# Patient Record
Sex: Female | Born: 1935 | Race: White | Hispanic: No | Smoking: Former smoker
Health system: Southern US, Community
[De-identification: ages and names within clinical notes are randomized; demographics above are authoritative.]

## PROBLEM LIST (undated history)

## (undated) DIAGNOSIS — M810 Age-related osteoporosis without current pathological fracture: Secondary | ICD-10-CM

## (undated) DIAGNOSIS — J329 Chronic sinusitis, unspecified: Secondary | ICD-10-CM

## (undated) DIAGNOSIS — K219 Gastro-esophageal reflux disease without esophagitis: Secondary | ICD-10-CM

## (undated) DIAGNOSIS — E119 Type 2 diabetes mellitus without complications: Secondary | ICD-10-CM

## (undated) DIAGNOSIS — N183 Chronic kidney disease, stage 3 unspecified: Secondary | ICD-10-CM

## (undated) DIAGNOSIS — K469 Unspecified abdominal hernia without obstruction or gangrene: Secondary | ICD-10-CM

## (undated) DIAGNOSIS — J449 Chronic obstructive pulmonary disease, unspecified: Secondary | ICD-10-CM

## (undated) DIAGNOSIS — M5430 Sciatica, unspecified side: Secondary | ICD-10-CM

## (undated) DIAGNOSIS — M549 Dorsalgia, unspecified: Secondary | ICD-10-CM

## (undated) HISTORY — PX: HERNIA REPAIR: SHX51

## (undated) HISTORY — PX: OTHER SURGICAL HISTORY: SHX169

## (undated) HISTORY — DX: Chronic kidney disease, stage 3 unspecified: N18.30

## (undated) HISTORY — PX: CHOLECYSTECTOMY: SHX55

## (undated) HISTORY — DX: Chronic obstructive pulmonary disease, unspecified: J44.9

## (undated) HISTORY — DX: Age-related osteoporosis without current pathological fracture: M81.0

## (undated) HISTORY — DX: Chronic sinusitis, unspecified: J32.9

## (undated) HISTORY — DX: Sciatica, unspecified side: M54.30

## (undated) HISTORY — DX: Gastro-esophageal reflux disease without esophagitis: K21.9

## (undated) HISTORY — DX: Unspecified abdominal hernia without obstruction or gangrene: K46.9

## (undated) HISTORY — PX: BLADDER SUSPENSION: SHX72

## (undated) HISTORY — PX: DILATION AND CURETTAGE OF UTERUS: SHX78

## (undated) HISTORY — DX: Type 2 diabetes mellitus without complications: E11.9

## (undated) HISTORY — DX: Chronic kidney disease, stage 3 (moderate): N18.3

## (undated) HISTORY — DX: Dorsalgia, unspecified: M54.9

---

## 2018-10-12 ENCOUNTER — Encounter (INDEPENDENT_AMBULATORY_CARE_PROVIDER_SITE_OTHER): Payer: Self-pay | Admitting: *Deleted

## 2018-10-12 ENCOUNTER — Encounter (INDEPENDENT_AMBULATORY_CARE_PROVIDER_SITE_OTHER): Payer: Self-pay | Admitting: Internal Medicine

## 2018-10-12 ENCOUNTER — Ambulatory Visit (INDEPENDENT_AMBULATORY_CARE_PROVIDER_SITE_OTHER): Payer: Medicare Other | Admitting: Internal Medicine

## 2018-10-12 VITALS — BP 118/72 | HR 99 | Temp 97.7°F | Ht 67.0 in | Wt 151.3 lb

## 2018-10-12 DIAGNOSIS — R197 Diarrhea, unspecified: Secondary | ICD-10-CM | POA: Insufficient documentation

## 2018-10-12 DIAGNOSIS — K5900 Constipation, unspecified: Secondary | ICD-10-CM | POA: Insufficient documentation

## 2018-10-12 DIAGNOSIS — R110 Nausea: Secondary | ICD-10-CM

## 2018-10-12 DIAGNOSIS — R634 Abnormal weight loss: Secondary | ICD-10-CM | POA: Insufficient documentation

## 2018-10-12 LAB — CBC WITH DIFFERENTIAL/PLATELET
Absolute Monocytes: 608 cells/uL (ref 200–950)
BASOS PCT: 0.8 %
Basophils Absolute: 64 cells/uL (ref 0–200)
Eosinophils Absolute: 288 cells/uL (ref 15–500)
Eosinophils Relative: 3.6 %
HCT: 36.5 % (ref 35.0–45.0)
Hemoglobin: 12 g/dL (ref 11.7–15.5)
Lymphs Abs: 3240 cells/uL (ref 850–3900)
MCH: 28.6 pg (ref 27.0–33.0)
MCHC: 32.9 g/dL (ref 32.0–36.0)
MCV: 86.9 fL (ref 80.0–100.0)
MPV: 10.6 fL (ref 7.5–12.5)
Monocytes Relative: 7.6 %
Neutro Abs: 3800 cells/uL (ref 1500–7800)
Neutrophils Relative %: 47.5 %
Platelets: 248 10*3/uL (ref 140–400)
RBC: 4.2 10*6/uL (ref 3.80–5.10)
RDW: 12.1 % (ref 11.0–15.0)
TOTAL LYMPHOCYTE: 40.5 %
WBC: 8 10*3/uL (ref 3.8–10.8)

## 2018-10-12 LAB — COMPREHENSIVE METABOLIC PANEL
AG Ratio: 1.6 (calc) (ref 1.0–2.5)
ALT: 18 U/L (ref 6–29)
AST: 23 U/L (ref 10–35)
Albumin: 4 g/dL (ref 3.6–5.1)
Alkaline phosphatase (APISO): 52 U/L (ref 33–130)
BUN: 17 mg/dL (ref 7–25)
CO2: 29 mmol/L (ref 20–32)
Calcium: 9.4 mg/dL (ref 8.6–10.4)
Chloride: 105 mmol/L (ref 98–110)
Creat: 0.69 mg/dL (ref 0.60–0.88)
Globulin: 2.5 g/dL (calc) (ref 1.9–3.7)
Glucose, Bld: 104 mg/dL (ref 65–139)
Potassium: 4.2 mmol/L (ref 3.5–5.3)
SODIUM: 144 mmol/L (ref 135–146)
TOTAL PROTEIN: 6.5 g/dL (ref 6.1–8.1)
Total Bilirubin: 0.3 mg/dL (ref 0.2–1.2)

## 2018-10-12 NOTE — Patient Instructions (Signed)
Labs and US abdomen.  

## 2018-10-12 NOTE — Progress Notes (Signed)
   Subjective:    Patient ID: Taylor Sellers, female    DOB: 09-24-1932, 85 y.o.   MRN: [id number] Oxygen dependent. HPI Referred by Gaspar Skeeters MD  for abdominal pain. She has nausea, constipation, diarrhea. Symptoms for years. She alternates between constipation and diarrhea. She takes Gummie fiber for her diarrhea which has helped. She has a BM day which she describes as small and soft. She is incontinent of urine. She says sometimes she has nausea which comes on suddenly which occurs several times a week which she says is related to eating.  She tells me she has lost from 200 to 151 over a 3 yrs period since her husband died.  She says her appetite is not good since her husband died.  Last colonoscopy was normal in [place] 02/1998.     Review of Systems Past Medical History:  Diagnosis Date  . Abdominal hernia   . Back pain   . CKD (chronic kidney disease), stage III (HCC)   . COPD (chronic obstructive pulmonary disease) (HCC)   . Diabetes (HCC)   . GERD (gastroesophageal reflux disease)   . Osteoporosis   . Sciatica   . Sinusitis       Allergies  Allergen Reactions  . Augmentin [Amoxicillin-Pot Clavulanate]   . Bactrim [Sulfamethoxazole-Trimethoprim]   . Biaxin [Clarithromycin]   . Ceclor [Cefaclor]   . Codeine   . Flagyl [Metronidazole]   . Ultracet [Tramadol-Acetaminophen]     Current Outpatient Medications on File Prior to Visit  Medication Sig Dispense Refill  . albuterol (PROVENTIL HFA;VENTOLIN HFA) 108 (90 Base) MCG/ACT inhaler Inhale into the lungs every 6 (six) hours as needed for wheezing or shortness of breath.    Marland Kitchen atorvastatin (LIPITOR) 20 MG tablet Take 20 mg by mouth daily.    . carboxymethylcellulose (REFRESH PLUS) 0.5 % SOLN 1 drop 3 (three) times daily as needed.    . ergocalciferol (VITAMIN D2) 1.25 MG (50000 UT) capsule Take 50,000 Units by mouth once a week.    . fentaNYL (DURAGESIC) 12 MCG/HR Place onto the skin every 3 (three) days.    .  fluticasone (VERAMYST) 27.5 MCG/SPRAY nasal spray Place 2 sprays into the nose daily.    . metFORMIN (GLUCOPHAGE) 500 MG tablet Take by mouth 2 (two) times daily with a meal.    . RABEprazole (ACIPHEX) 20 MG tablet Take 20 mg by mouth daily.    . traMADol (ULTRAM) 50 MG tablet Take by mouth every 6 (six) hours as needed.     No current facility-administered medications on file prior to visit.         Objective:   Physical Exam Blood pressure 118/72, pulse 99, temperature 97.7 F (36.5 C), height 5\' 7"  (1.702 m), weight 151 lb 4.8 oz (68.6 kg). Alert and oriented. Skin warm and dry. Oral mucosa is moist.   . Sclera anicteric, conjunctivae is pink. Thyroid not enlarged. No cervical lymphadenopathy. Lungs clear. Heart regular rate and rhythm.  Abdomen is soft. Bowel sounds are positive. No hepatomegaly. No abdominal masses felt. No tenderness.  No edema to lower extremities.          Assessment & Plan:  Chronic nausea. Am going to get labs today. Will get an US abdomen.  Constipation/Diarrhea Chronic: better since starting the Gummie fiber. She will continue.  Further recommendations to follow.

## 2018-10-20 ENCOUNTER — Ambulatory Visit (HOSPITAL_COMMUNITY)
Admission: RE | Admit: 2018-10-20 | Discharge: 2018-10-20 | Disposition: A | Discharge status: Home / Self Care | Payer: Medicare Other | Source: Ambulatory Visit | Attending: Internal Medicine | Admitting: Internal Medicine

## 2018-10-20 DIAGNOSIS — R634 Abnormal weight loss: Secondary | ICD-10-CM | POA: Diagnosis not present

## 2018-10-20 DIAGNOSIS — R110 Nausea: Secondary | ICD-10-CM | POA: Insufficient documentation

## 2019-10-16 IMAGING — US US ABDOMEN COMPLETE
1 series · 14 of 25 positions shown · non-contrast
Comparison: None.

CLINICAL DATA: 85-year-old female with a history of nausea and
weight loss

EXAM:
ABDOMEN ULTRASOUND COMPLETE

[Series 1: us abdomen complete · 14 of 99 slices shown]
[im 1/99]
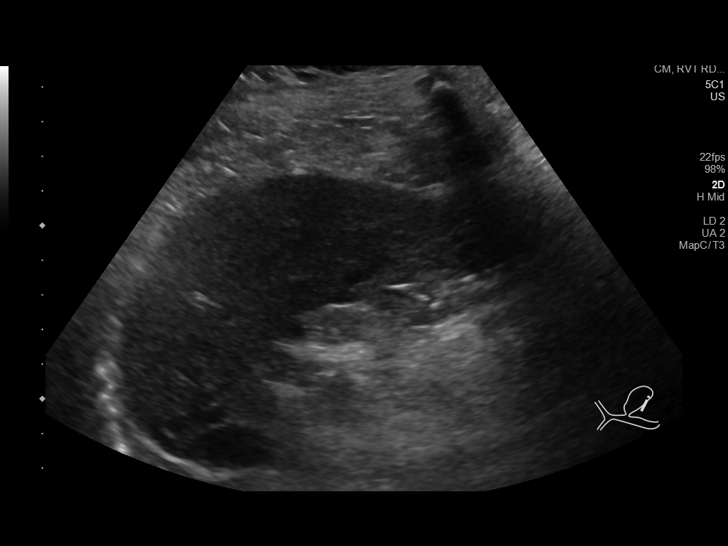
[im 9/99]
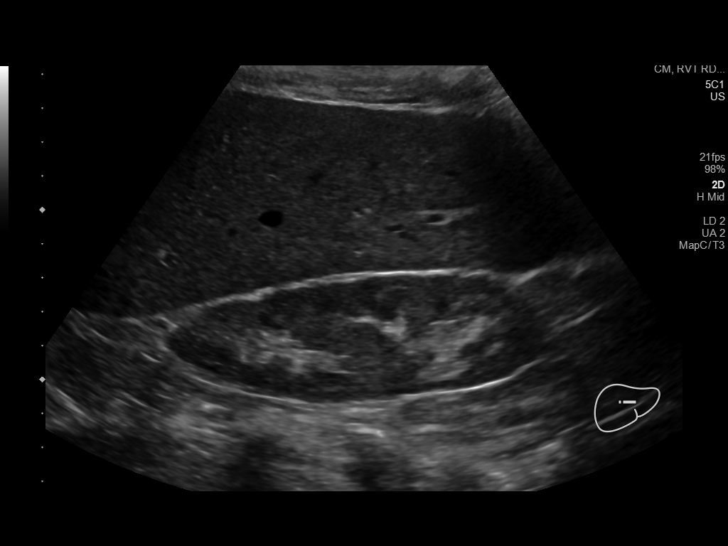
[im 17/99]
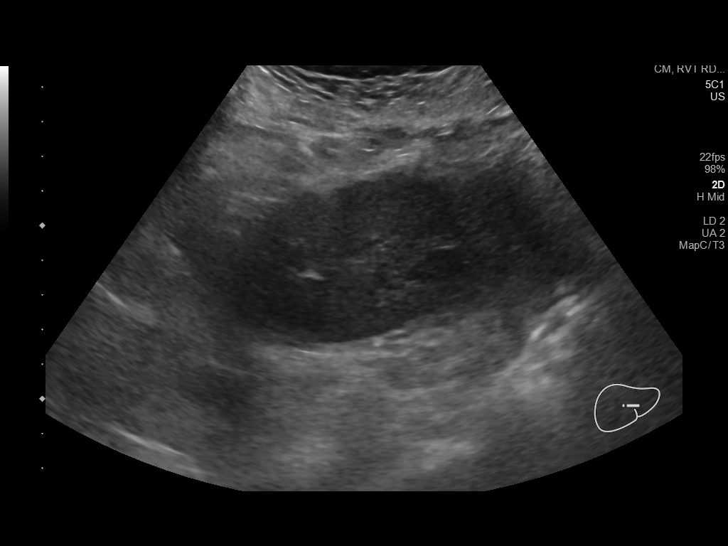
[im 25/99]
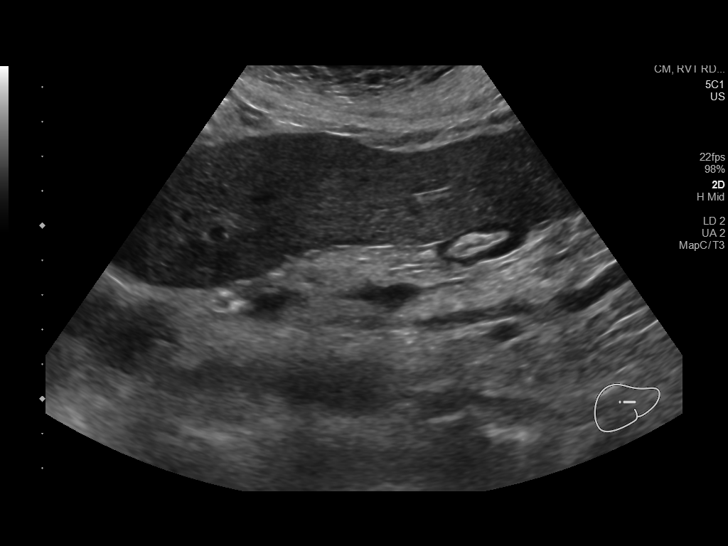
[im 33/99]
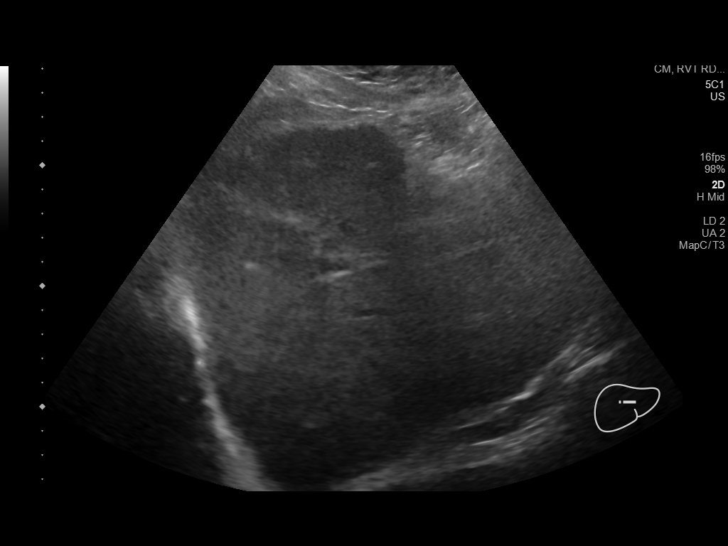
[im 37/99]
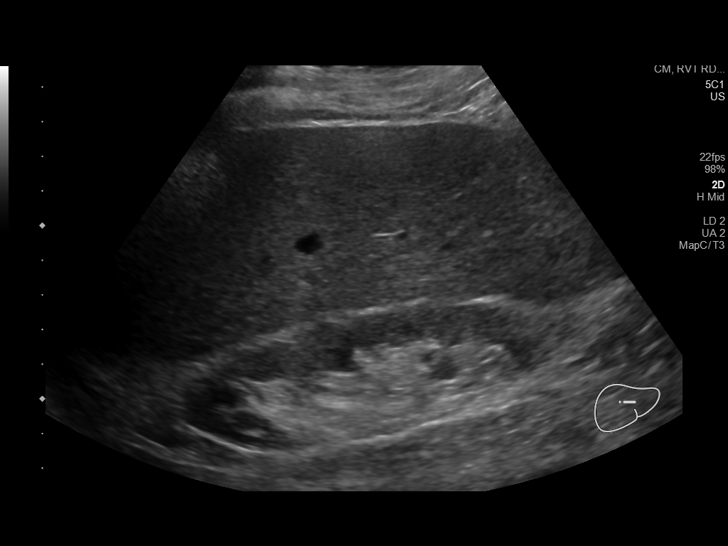
[im 45/99]
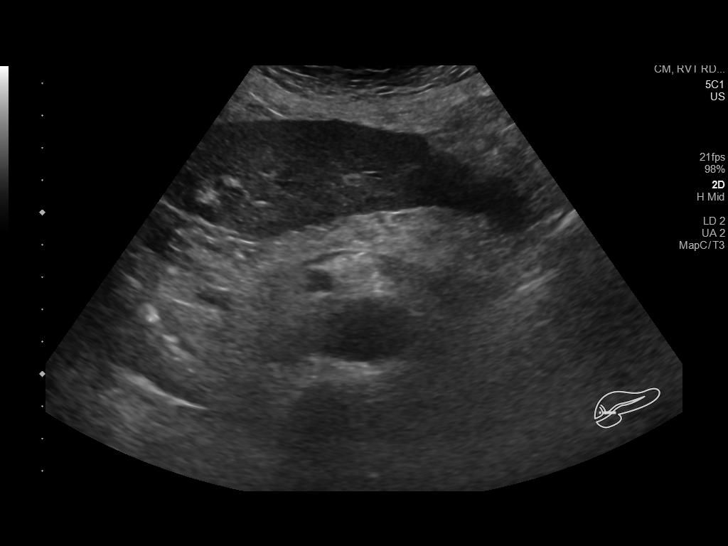
[im 54/99]
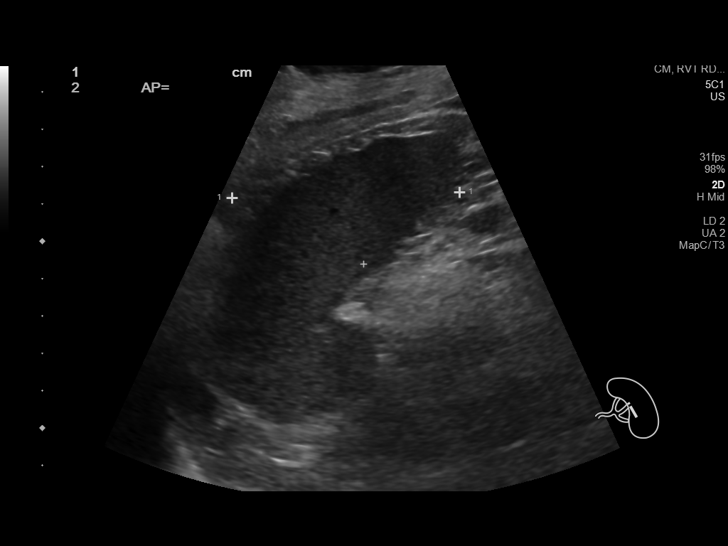
[im 62/99]
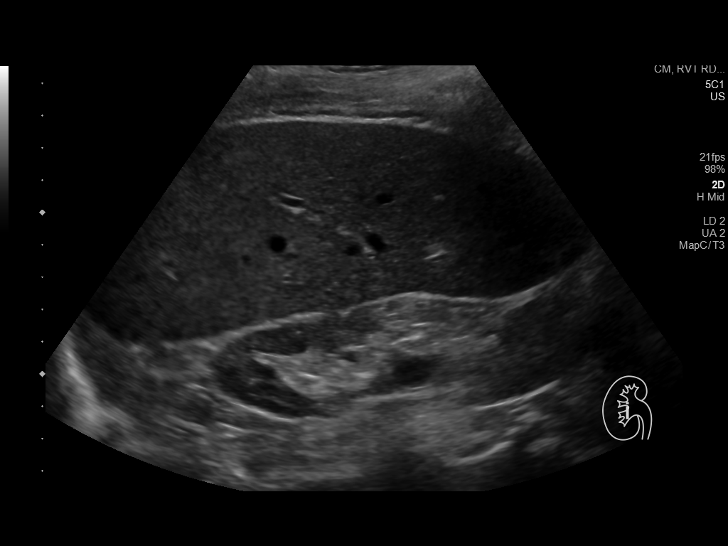
[im 66/99]
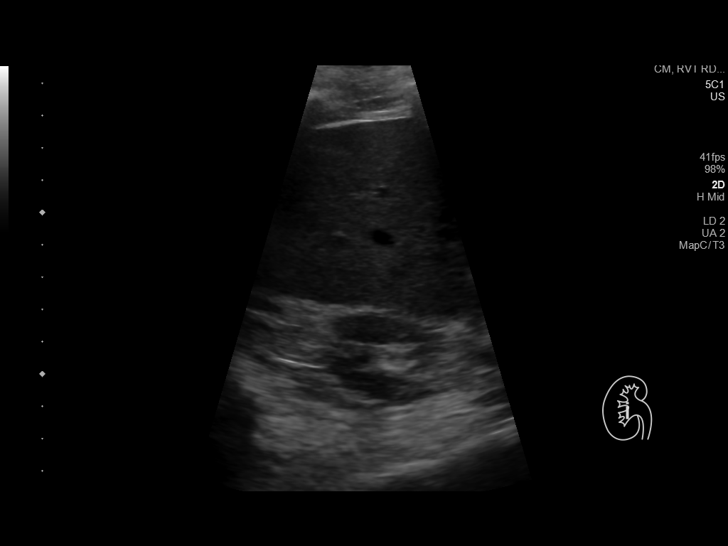
[im 74/99]
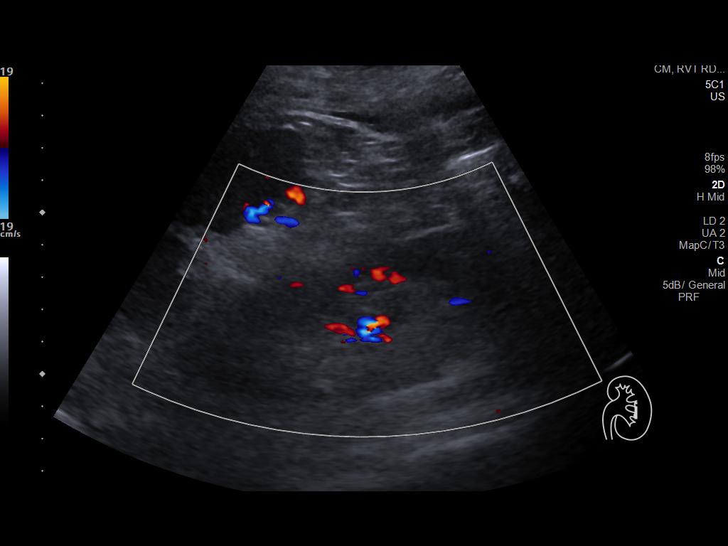
[im 82/99]
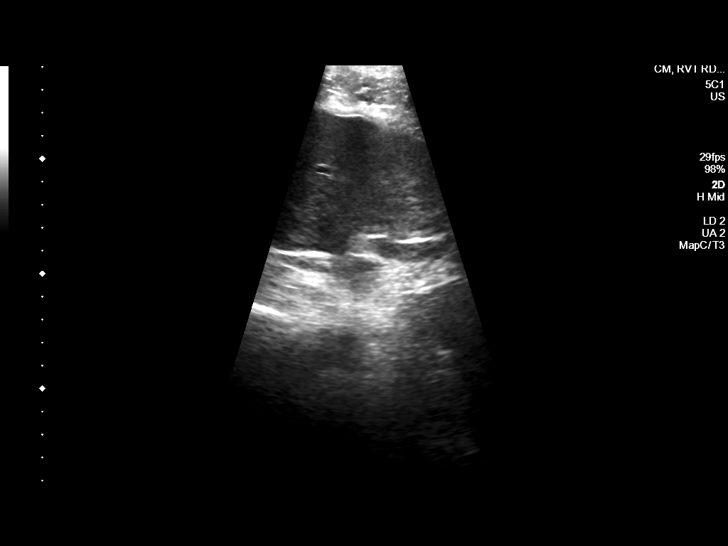
[im 90/99]
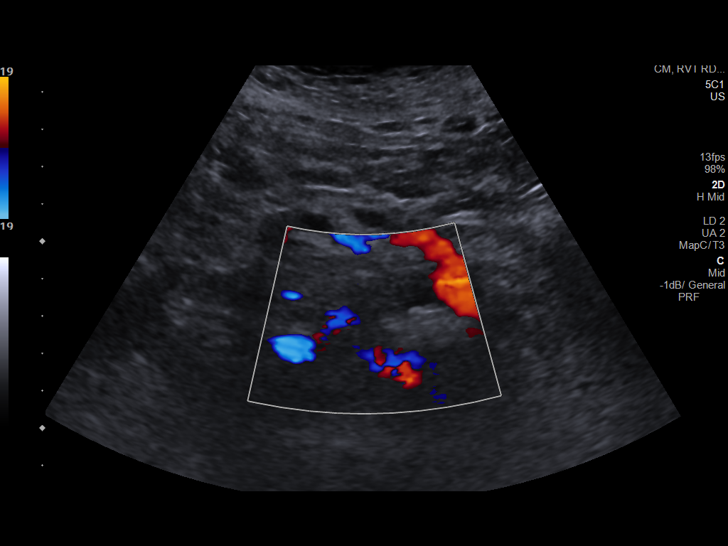
[im 99/99]
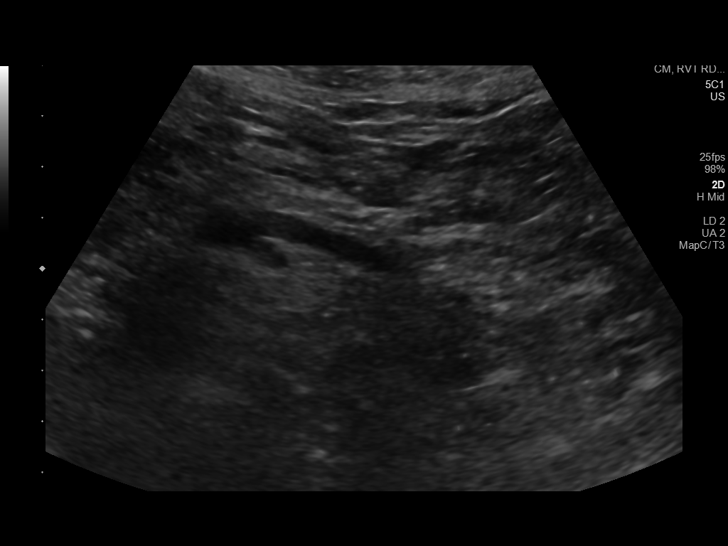

[14 of 25 positions shown; findings below may reference images not displayed]

FINDINGS: Gallbladder: Cholecystectomy

Common bile duct: Diameter: 6 mm-7 mm

Liver: No focal lesion identified. Within normal limits in
parenchymal echogenicity.

IVC: No abnormality visualized.

Pancreas: Visualized portion unremarkable.

Spleen: Size and appearance within normal limits.

Right Kidney: Length: 11.0 cm. Echogenicity within normal limits. No
mass or hydronephrosis visualized.

Left Kidney: Length: 11.4 cm. Echogenicity within normal limits. No
mass or hydronephrosis visualized.

Abdominal aorta: Greatest diameter 3.1 cm

Other findings: None.
IMPRESSION: Cholecystectomy.

Unremarkable sonographic survey of the liver.

Greatest diameter of the aorta 3.1 cm. Recommend followup by
ultrasound in 3 years. This recommendation follows ACR consensus
guidelines: White Paper of the ACR Incidental Findings Committee II

## 2020-05-21 DEATH — deceased
